# Patient Record
Sex: Female | Born: 1960 | Race: White | Hispanic: No | Marital: Married | Smoking: Never smoker
Health system: Southern US, Community
[De-identification: ages and names within clinical notes are randomized; demographics above are authoritative.]

## PROBLEM LIST (undated history)

## (undated) DIAGNOSIS — T7840XA Allergy, unspecified, initial encounter: Secondary | ICD-10-CM

## (undated) DIAGNOSIS — L719 Rosacea, unspecified: Secondary | ICD-10-CM

## (undated) DIAGNOSIS — G43909 Migraine, unspecified, not intractable, without status migrainosus: Secondary | ICD-10-CM

## (undated) DIAGNOSIS — I10 Essential (primary) hypertension: Secondary | ICD-10-CM

## (undated) HISTORY — DX: Migraine, unspecified, not intractable, without status migrainosus: G43.909

## (undated) HISTORY — DX: Rosacea, unspecified: L71.9

## (undated) HISTORY — DX: Essential (primary) hypertension: I10

## (undated) HISTORY — DX: Allergy, unspecified, initial encounter: T78.40XA

---

## 1999-08-06 ENCOUNTER — Other Ambulatory Visit: Admission: RE | Admit: 1999-08-06 | Discharge: 1999-08-06 | Payer: Self-pay | Admitting: Obstetrics and Gynecology

## 2000-09-06 ENCOUNTER — Encounter: Payer: Self-pay | Admitting: Obstetrics and Gynecology

## 2000-09-06 ENCOUNTER — Encounter: Admission: RE | Admit: 2000-09-06 | Discharge: 2000-09-06 | Payer: Self-pay | Admitting: Obstetrics and Gynecology

## 2000-09-10 ENCOUNTER — Encounter: Payer: Self-pay | Admitting: Obstetrics and Gynecology

## 2000-09-10 ENCOUNTER — Encounter: Admission: RE | Admit: 2000-09-10 | Discharge: 2000-09-10 | Payer: Self-pay | Admitting: Obstetrics and Gynecology

## 2001-03-21 ENCOUNTER — Encounter: Payer: Self-pay | Admitting: Obstetrics and Gynecology

## 2001-03-21 ENCOUNTER — Encounter: Admission: RE | Admit: 2001-03-21 | Discharge: 2001-03-21 | Payer: Self-pay | Admitting: Obstetrics and Gynecology

## 2001-09-16 ENCOUNTER — Encounter: Payer: Self-pay | Admitting: Obstetrics and Gynecology

## 2001-09-16 ENCOUNTER — Encounter: Admission: RE | Admit: 2001-09-16 | Discharge: 2001-09-16 | Payer: Self-pay | Admitting: Obstetrics and Gynecology

## 2001-10-17 ENCOUNTER — Encounter: Payer: Self-pay | Admitting: *Deleted

## 2001-10-17 ENCOUNTER — Encounter: Admission: RE | Admit: 2001-10-17 | Discharge: 2001-10-17 | Payer: Self-pay | Admitting: *Deleted

## 2001-10-31 ENCOUNTER — Encounter: Admission: RE | Admit: 2001-10-31 | Discharge: 2002-01-29 | Payer: Self-pay | Admitting: *Deleted

## 2001-11-24 ENCOUNTER — Other Ambulatory Visit: Admission: RE | Admit: 2001-11-24 | Discharge: 2001-11-24 | Payer: Self-pay | Admitting: Obstetrics & Gynecology

## 2002-05-08 ENCOUNTER — Encounter: Admission: RE | Admit: 2002-05-08 | Discharge: 2002-08-06 | Payer: Self-pay | Admitting: *Deleted

## 2003-07-13 ENCOUNTER — Other Ambulatory Visit: Admission: RE | Admit: 2003-07-13 | Discharge: 2003-07-13 | Payer: Self-pay

## 2004-03-03 ENCOUNTER — Encounter: Admission: RE | Admit: 2004-03-03 | Discharge: 2004-03-03 | Payer: Self-pay | Admitting: Family Medicine

## 2005-11-13 ENCOUNTER — Encounter: Admission: RE | Admit: 2005-11-13 | Discharge: 2005-11-13 | Payer: Self-pay | Admitting: Family Medicine

## 2006-11-17 ENCOUNTER — Encounter: Admission: RE | Admit: 2006-11-17 | Discharge: 2006-11-17 | Payer: Self-pay | Admitting: Family Medicine

## 2008-04-06 ENCOUNTER — Encounter: Admission: RE | Admit: 2008-04-06 | Discharge: 2008-04-06 | Payer: Self-pay | Admitting: Family Medicine

## 2009-07-04 ENCOUNTER — Encounter: Admission: RE | Admit: 2009-07-04 | Discharge: 2009-07-04 | Payer: Self-pay | Admitting: Family Medicine

## 2010-12-07 ENCOUNTER — Encounter: Payer: Self-pay | Admitting: Family Medicine

## 2011-03-23 ENCOUNTER — Other Ambulatory Visit: Payer: Self-pay | Admitting: Family Medicine

## 2011-03-23 DIAGNOSIS — Z1231 Encounter for screening mammogram for malignant neoplasm of breast: Secondary | ICD-10-CM

## 2011-05-01 ENCOUNTER — Ambulatory Visit
Admission: RE | Admit: 2011-05-01 | Discharge: 2011-05-01 | Disposition: A | Discharge status: Home / Self Care | Payer: 59 | Source: Ambulatory Visit | Attending: Family Medicine | Admitting: Family Medicine

## 2011-05-01 DIAGNOSIS — Z1231 Encounter for screening mammogram for malignant neoplasm of breast: Secondary | ICD-10-CM

## 2011-05-04 ENCOUNTER — Other Ambulatory Visit: Payer: Self-pay | Admitting: Family Medicine

## 2011-05-04 DIAGNOSIS — R928 Other abnormal and inconclusive findings on diagnostic imaging of breast: Secondary | ICD-10-CM

## 2011-05-07 LAB — HM PAP SMEAR

## 2011-05-11 ENCOUNTER — Ambulatory Visit
Admission: RE | Admit: 2011-05-11 | Discharge: 2011-05-11 | Disposition: A | Discharge status: Home / Self Care | Payer: 59 | Source: Ambulatory Visit | Attending: Family Medicine | Admitting: Family Medicine

## 2011-05-11 DIAGNOSIS — R928 Other abnormal and inconclusive findings on diagnostic imaging of breast: Secondary | ICD-10-CM

## 2012-05-25 ENCOUNTER — Other Ambulatory Visit: Payer: Self-pay | Admitting: Family Medicine

## 2012-05-25 DIAGNOSIS — Z1231 Encounter for screening mammogram for malignant neoplasm of breast: Secondary | ICD-10-CM

## 2012-06-20 ENCOUNTER — Ambulatory Visit: Payer: 59

## 2012-07-11 ENCOUNTER — Ambulatory Visit
Admission: RE | Admit: 2012-07-11 | Discharge: 2012-07-11 | Disposition: A | Discharge status: Home / Self Care | Payer: 59 | Source: Ambulatory Visit | Attending: Family Medicine | Admitting: Family Medicine

## 2012-07-11 DIAGNOSIS — Z1231 Encounter for screening mammogram for malignant neoplasm of breast: Secondary | ICD-10-CM

## 2013-06-22 ENCOUNTER — Other Ambulatory Visit: Payer: Self-pay | Admitting: Family Medicine

## 2013-07-10 ENCOUNTER — Encounter: Payer: Self-pay | Admitting: Family Medicine

## 2013-07-10 ENCOUNTER — Ambulatory Visit (INDEPENDENT_AMBULATORY_CARE_PROVIDER_SITE_OTHER): Payer: 59 | Admitting: Family Medicine

## 2013-07-10 VITALS — BP 114/78 | HR 85 | Ht 62.5 in | Wt 168.0 lb

## 2013-07-10 DIAGNOSIS — Z309 Encounter for contraceptive management, unspecified: Secondary | ICD-10-CM

## 2013-07-10 DIAGNOSIS — F411 Generalized anxiety disorder: Secondary | ICD-10-CM

## 2013-07-10 DIAGNOSIS — M7989 Other specified soft tissue disorders: Secondary | ICD-10-CM

## 2013-07-10 DIAGNOSIS — G43909 Migraine, unspecified, not intractable, without status migrainosus: Secondary | ICD-10-CM

## 2013-07-10 DIAGNOSIS — I10 Essential (primary) hypertension: Secondary | ICD-10-CM

## 2013-07-10 DIAGNOSIS — Z23 Encounter for immunization: Secondary | ICD-10-CM

## 2013-07-10 MED ORDER — ELETRIPTAN HYDROBROMIDE 40 MG PO TABS: 40.0000 mg | ORAL_TABLET | ORAL | Status: AC | PRN

## 2013-07-10 MED ORDER — FLUOXETINE HCL 40 MG PO CAPS: 40.0000 mg | ORAL_CAPSULE | Freq: Every day | ORAL | Status: AC

## 2013-07-10 MED ORDER — AMLODIPINE BESY-BENAZEPRIL HCL 5-10 MG PO CAPS: 1.0000 | ORAL_CAPSULE | Freq: Every day | ORAL | Status: AC

## 2013-07-10 MED ORDER — HYDROCHLOROTHIAZIDE 25 MG PO TABS: 25.0000 mg | ORAL_TABLET | Freq: Every day | ORAL | Status: AC

## 2013-07-10 MED ORDER — DROSPIRENONE-ETHINYL ESTRADIOL 3-0.02 MG PO TABS: 1.0000 | ORAL_TABLET | Freq: Every day | ORAL | Status: DC

## 2013-07-10 NOTE — Progress Notes (Signed)
  Subjective:    Patient ID: Valerie Glass, female    DOB: 10/25/1961, 52 y.o.   MRN: [id number]  HPI  [name] is here today to discuss the conditions listed below:  1)  Mood:  Her mood is stable on fluoxetine.  She needs a refill on it.   2)  Hypertension:  She has done well on her Lotrel.  Occasionally she takes HCTZ when she feels "puffy."  She needs refills on both medications.   3)  Oral Contraceptives:  She needs a refill on her Yaz.     Review of Systems  Constitutional: Negative.   HENT: Negative.   Eyes: Negative.   Respiratory: Negative.   Cardiovascular: Negative.   Gastrointestinal: Negative.   Endocrine: Negative.   Genitourinary: Negative.   Musculoskeletal: Negative.   Skin: Negative.   Allergic/Immunologic: Negative.   Neurological: Negative.   Hematological: Negative.   Psychiatric/Behavioral: Negative.     Past Medical History  Diagnosis Date  . Hypertension   . Allergy   . Rosacea   . Migraine     Family History  Problem Relation Age of Onset  . Cancer Mother     Breast Cancer  . Hypertension Father   . Cancer Father     Colon Cancer  . Diabetes Maternal Uncle   . Heart disease Maternal Grandmother   . Diabetes Maternal Grandfather     History   Social History Narrative   Marital Status:  Married Therapist, art)   Children:  G1 P1001 Burnett Harry)   Pets:  Dogs (2)   Living Situation: Lives with spouse and daughter   Occupation: Chief of Staff   Education: Master's Degree Licensed conveyancer)    Tobacco Use/Exposure:  None    Alcohol Use:  None   Drug Use:  None   Diet:  [employer] Watchers   Exercise:  3 x week   Hobbies: Crafts, Reading      Objective:   Physical Exam  Vitals reviewed. Constitutional: She is oriented to person, place, and time. She appears well-developed and well-nourished. No distress.  Cardiovascular: Normal rate, regular rhythm and normal heart sounds.   Pulmonary/Chest: Breath sounds normal.  Neurological: She is  alert and oriented to person, place, and time.  Psychiatric: She has a normal mood and affect.          Assessment & Plan:

## 2013-08-20 ENCOUNTER — Encounter: Payer: Self-pay | Admitting: Family Medicine

## 2013-09-05 DIAGNOSIS — Z309 Encounter for contraceptive management, unspecified: Secondary | ICD-10-CM | POA: Insufficient documentation

## 2013-09-05 DIAGNOSIS — I10 Essential (primary) hypertension: Secondary | ICD-10-CM | POA: Insufficient documentation

## 2013-09-05 DIAGNOSIS — F411 Generalized anxiety disorder: Secondary | ICD-10-CM | POA: Insufficient documentation

## 2013-09-05 DIAGNOSIS — G43909 Migraine, unspecified, not intractable, without status migrainosus: Secondary | ICD-10-CM | POA: Insufficient documentation

## 2013-09-05 DIAGNOSIS — M7989 Other specified soft tissue disorders: Secondary | ICD-10-CM | POA: Insufficient documentation

## 2013-09-05 DIAGNOSIS — Z23 Encounter for immunization: Secondary | ICD-10-CM | POA: Insufficient documentation

## 2013-09-05 NOTE — Assessment & Plan Note (Signed)
Refilled her YAZ.

## 2013-09-05 NOTE — Assessment & Plan Note (Signed)
Refilled her Lotrel.

## 2013-09-05 NOTE — Assessment & Plan Note (Signed)
Refilled her Relpax.

## 2013-09-05 NOTE — Assessment & Plan Note (Signed)
Refilled her HCTZ 

## 2013-09-05 NOTE — Assessment & Plan Note (Addendum)
Refilled her Prozac.

## 2013-09-05 NOTE — Assessment & Plan Note (Signed)
The patient confirmed that they are not allergic to eggs and have never had a bad reaction with the flu shot in the past.  The vaccination was given without difficulty.   

## 2013-12-01 ENCOUNTER — Ambulatory Visit (INDEPENDENT_AMBULATORY_CARE_PROVIDER_SITE_OTHER): Payer: 59 | Admitting: Family Medicine

## 2013-12-01 ENCOUNTER — Encounter (INDEPENDENT_AMBULATORY_CARE_PROVIDER_SITE_OTHER): Payer: Self-pay

## 2013-12-01 ENCOUNTER — Encounter: Payer: Self-pay | Admitting: Family Medicine

## 2013-12-01 VITALS — BP 122/84 | HR 86 | Resp 16 | Ht 62.25 in | Wt 176.0 lb

## 2013-12-01 DIAGNOSIS — B3731 Acute candidiasis of vulva and vagina: Secondary | ICD-10-CM

## 2013-12-01 DIAGNOSIS — H0019 Chalazion unspecified eye, unspecified eyelid: Secondary | ICD-10-CM

## 2013-12-01 DIAGNOSIS — H0011 Chalazion right upper eyelid: Secondary | ICD-10-CM

## 2013-12-01 DIAGNOSIS — B373 Candidiasis of vulva and vagina: Secondary | ICD-10-CM

## 2013-12-01 MED ORDER — FLUCONAZOLE 150 MG PO TABS: 150.0000 mg | ORAL_TABLET | Freq: Once | ORAL | Status: AC

## 2013-12-01 MED ORDER — CEPHALEXIN 500 MG PO CAPS: 500.0000 mg | ORAL_CAPSULE | Freq: Four times a day (QID) | ORAL | Status: AC

## 2013-12-01 MED ORDER — TERCONAZOLE 0.8 % VA CREA: 1.0000 | TOPICAL_CREAM | Freq: Every day | VAGINAL | Status: AC

## 2013-12-01 MED ORDER — ERYTHROMYCIN 5 MG/GM OP OINT: TOPICAL_OINTMENT | OPHTHALMIC | Status: AC

## 2013-12-01 NOTE — Progress Notes (Signed)
Subjective:    Patient ID: Valerie Glass, female    DOB: 07-Jul-1961, 53 y.o.   MRN: [id number]  HPI  Valerie Glass is here today complaining of a swelling in her right eyelid which she has had for about a month.  Her problem began as a small stye which has gotten larger and is painful.  She has not noticed any discharge.  She has used [employer] Eye Wipes and has applied warm compresses which have not helped very much.      Review of Systems  Eyes: Negative for visual disturbance.       Swelling and redness in her right eyelid.       Past Medical History  Diagnosis Date  . Hypertension   . Allergy   . Rosacea   . Migraine      History   Social History Narrative   Marital Status:  Married Therapist, art(Jeff)   Children:  G1 P1001 Burnett Harry(Shelly)   Pets:  Dogs (2)   Living Situation: Lives with spouse and daughter   Occupation: Environmental education officerChildren Librarian - Chief of [employer]   Education: Psychiatric nurseMaster's Degree (Library Science)    Tobacco Use/Exposure:  None    Alcohol Use:  None   Drug Use:  None   Diet:  [employer] Watchers   Exercise:  3 x week   Hobbies: Crafts, Reading     Family History  Problem Relation Age of Onset  . Cancer Mother     Breast Cancer  . Hypertension Father   . Cancer Father     Colon Cancer  . Diabetes Maternal Uncle   . Heart disease Maternal Grandmother   . Diabetes Maternal Grandfather      Current Outpatient Prescriptions on File Prior to Visit  Medication Sig Dispense Refill  . amLODipine-benazepril (LOTREL) 5-10 MG per capsule Take 1 capsule by mouth daily.  90 capsule  3  . eletriptan (RELPAX) 40 MG tablet Take 1 tablet (40 mg total) by mouth as needed for migraine. One tablet by mouth at onset of headache. May repeat in 2 hours if headache persists or recurs.  12 tablet  11  . FLUoxetine (PROZAC) 40 MG capsule Take 1 capsule (40 mg total) by mouth daily.  90 capsule  1  . hydrochlorothiazide (HYDRODIURIL) 25 MG tablet Take 1 tablet (25 mg total) by mouth daily.  90  tablet  1   No current facility-administered medications on file prior to visit.     No Known Allergies   Immunization History  Administered Date(s) Administered  . Influenza, Seasonal, Injecte, Preservative Fre 07/10/2013  . Tdap 04/19/2008      Objective:   Physical Exam  Constitutional: She appears well-nourished. No distress.  HENT:  Head: Normocephalic.  Mouth/Throat: No oropharyngeal exudate.  Eyes: Conjunctivae are normal. Right eye exhibits no discharge. Left eye exhibits no discharge.    Neck: Neck supple.  Cardiovascular: Normal rate, regular rhythm and normal heart sounds.  Exam reveals no gallop and no friction rub.   No murmur heard. Pulmonary/Chest: Effort normal and breath sounds normal. She has no wheezes. She exhibits no tenderness.  Lymphadenopathy:    She has no cervical adenopathy.  Neurological: She is alert.  Skin: Skin is warm and dry. No rash noted.  Psychiatric: She has a normal mood and affect.      Assessment & Plan:    Valerie Glass was seen today for stye.  Diagnoses and associated orders for this visit:  Chalazion of right upper eyelid -  erythromycin ophthalmic ointment; Apply a 1/2 in strip of ointment QID to right upper eyelid for 7 days - cephALEXin (KEFLEX) 500 MG capsule; Take 1 capsule (500 mg total) by mouth 4 (four) times daily.  Candidiasis of genitalia in female - fluconazole (DIFLUCAN) 150 MG tablet; Take 1 tablet (150 mg total) by mouth once. - terconazole (TERAZOL 3) 0.8 % vaginal cream; Place 1 applicator vaginally at bedtime. X 3 Days

## 2013-12-01 NOTE — Patient Instructions (Addendum)
1)  Chalazion/Sty - Apply the antibiotic ointment 4 x per day for 7 days  If you are not seeing much improvement after 4 days then add the Keflex.  If you do not improve, F/U with your eye doctor.    Sty A sty (hordeolum) is an infection of a gland in the eyelid located at the base of the eyelash. A sty may develop a white or yellow head of pus. It can be puffy (swollen). Usually, the sty will burst and pus will come out on its own. They do not leave lumps in the eyelid once they drain. A sty is often confused with another form of cyst of the eyelid called a chalazion. Chalazions occur within the eyelid and not on the edge where the bases of the eyelashes are. They often are red, sore and then form firm lumps in the eyelid. CAUSES   Germs (bacteria).  Lasting (chronic) eyelid inflammation. SYMPTOMS   Tenderness, redness and swelling along the edge of the eyelid at the base of the eyelashes.  Sometimes, there is a white or yellow head of pus. It may or may not drain. DIAGNOSIS  An ophthalmologist will be able to distinguish between a sty and a chalazion and treat the condition appropriately.  TREATMENT   Styes are typically treated with warm packs (compresses) until drainage occurs.  In rare cases, medicines that kill germs (antibiotics) may be prescribed. These antibiotics may be in the form of drops, cream or pills.  If a hard lump has formed, it is generally necessary to do a small incision and remove the hardened contents of the cyst in a minor surgical procedure done in the office.  In suspicious cases, your caregiver may send the contents of the cyst to the lab to be certain that it is not a rare, but dangerous form of cancer of the glands of the eyelid. HOME CARE INSTRUCTIONS   Wash your hands often and dry them with a clean towel. Avoid touching your eyelid. This may spread the infection to other parts of the eye.  Apply heat to your eyelid for 10 to 20 minutes, several times a  day, to ease pain and help to heal it faster.  Do not squeeze the sty. Allow it to drain on its own. Wash your eyelid carefully 3 to 4 times per day to remove any pus. SEEK IMMEDIATE MEDICAL CARE IF:   Your eye becomes painful or puffy (swollen).  Your vision changes.  Your sty does not drain by itself within 3 days.  Your sty comes back within a short period of time, even with treatment.  You have redness (inflammation) around the eye.  You have a fever. Document Released: 08/12/2005 Document Revised: 01/25/2012 Document Reviewed: 04/16/2009 [facility] Patient Information 2014 [employer], [place].  [id number]

## 2013-12-26 ENCOUNTER — Other Ambulatory Visit: Payer: Self-pay

## 2013-12-26 DIAGNOSIS — Z1231 Encounter for screening mammogram for malignant neoplasm of breast: Secondary | ICD-10-CM

## 2013-12-26 DIAGNOSIS — Z803 Family history of malignant neoplasm of breast: Secondary | ICD-10-CM

## 2014-01-11 ENCOUNTER — Ambulatory Visit: Payer: 59

## 2014-03-06 ENCOUNTER — Other Ambulatory Visit: Payer: Self-pay | Admitting: Family Medicine

## 2014-05-11 ENCOUNTER — Other Ambulatory Visit: Payer: Self-pay | Admitting: Family Medicine

## 2014-05-11 DIAGNOSIS — E781 Pure hyperglyceridemia: Secondary | ICD-10-CM

## 2014-05-11 MED ORDER — OMEGA-3-ACID ETHYL ESTERS 1 G PO CAPS: 2.0000 g | ORAL_CAPSULE | Freq: Two times a day (BID) | ORAL | Status: AC

## 2015-02-27 ENCOUNTER — Other Ambulatory Visit: Payer: Self-pay

## 2015-02-27 DIAGNOSIS — Z1231 Encounter for screening mammogram for malignant neoplasm of breast: Secondary | ICD-10-CM

## 2015-03-15 ENCOUNTER — Ambulatory Visit: Payer: Self-pay

## 2015-03-27 ENCOUNTER — Ambulatory Visit
Admission: RE | Admit: 2015-03-27 | Discharge: 2015-03-27 | Disposition: A | Discharge status: Home / Self Care | Payer: 59 | Source: Ambulatory Visit

## 2015-03-27 DIAGNOSIS — Z1231 Encounter for screening mammogram for malignant neoplasm of breast: Secondary | ICD-10-CM

## 2015-03-29 ENCOUNTER — Other Ambulatory Visit: Payer: Self-pay | Admitting: Family Medicine

## 2015-03-29 DIAGNOSIS — R928 Other abnormal and inconclusive findings on diagnostic imaging of breast: Secondary | ICD-10-CM

## 2015-04-04 ENCOUNTER — Ambulatory Visit
Admission: RE | Admit: 2015-04-04 | Discharge: 2015-04-04 | Disposition: A | Discharge status: Home / Self Care | Payer: 59 | Source: Ambulatory Visit | Attending: Family Medicine | Admitting: Family Medicine

## 2015-04-04 DIAGNOSIS — R928 Other abnormal and inconclusive findings on diagnostic imaging of breast: Secondary | ICD-10-CM

## 2016-08-16 IMAGING — MG MM SCREENING BREAST TOMO BILATERAL
8 series · 8 of 24 positions shown · non-contrast
Comparison: Previous exam(s).

CLINICAL DATA: Screening.

EXAM:
DIGITAL SCREENING BILATERAL MAMMOGRAM WITH 3D TOMO WITH CAD

[L MLO]
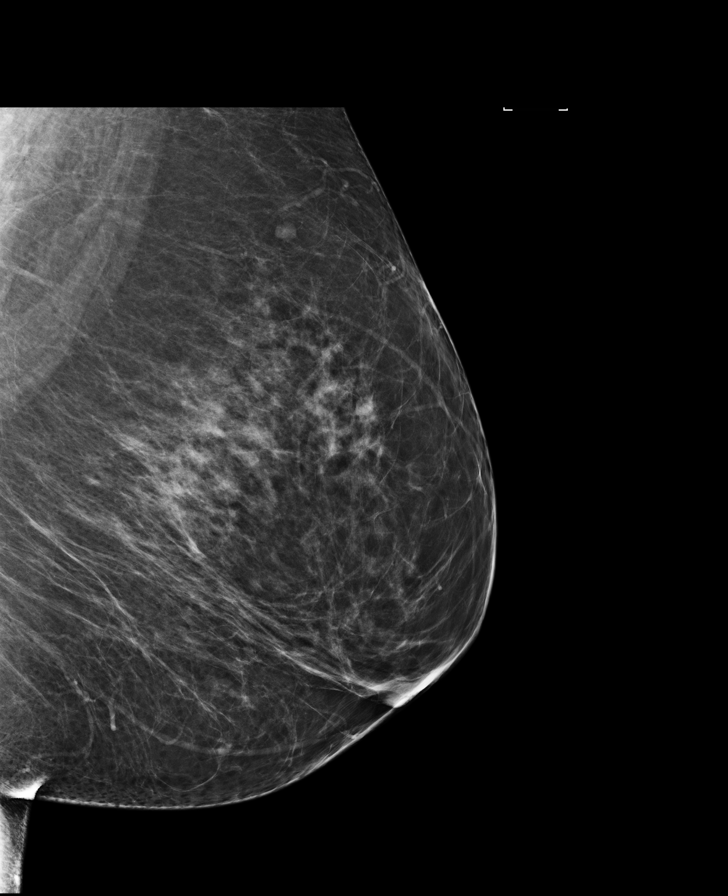

[R MLO]
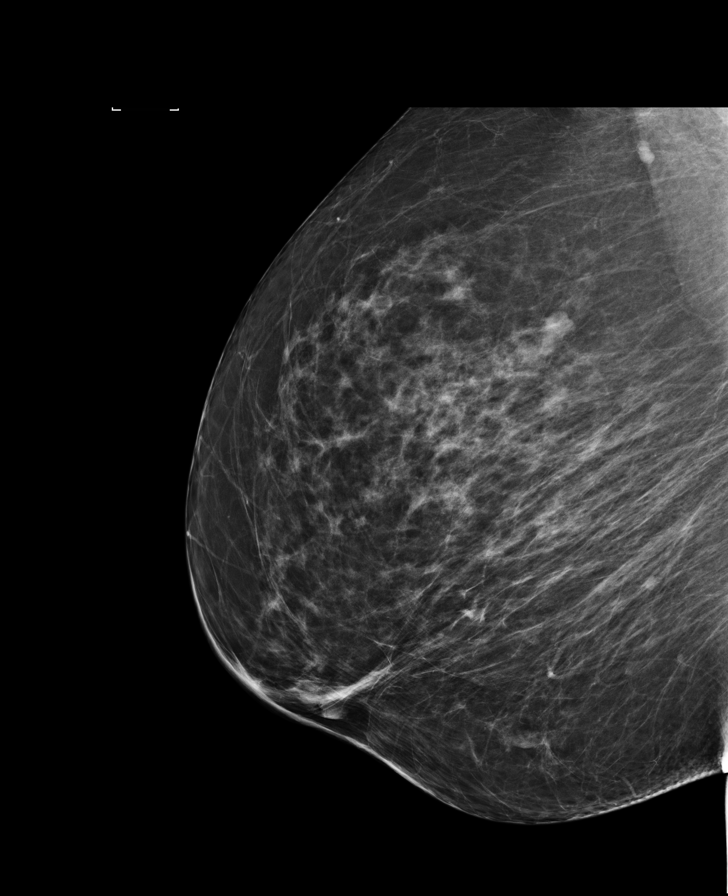

[L CC]
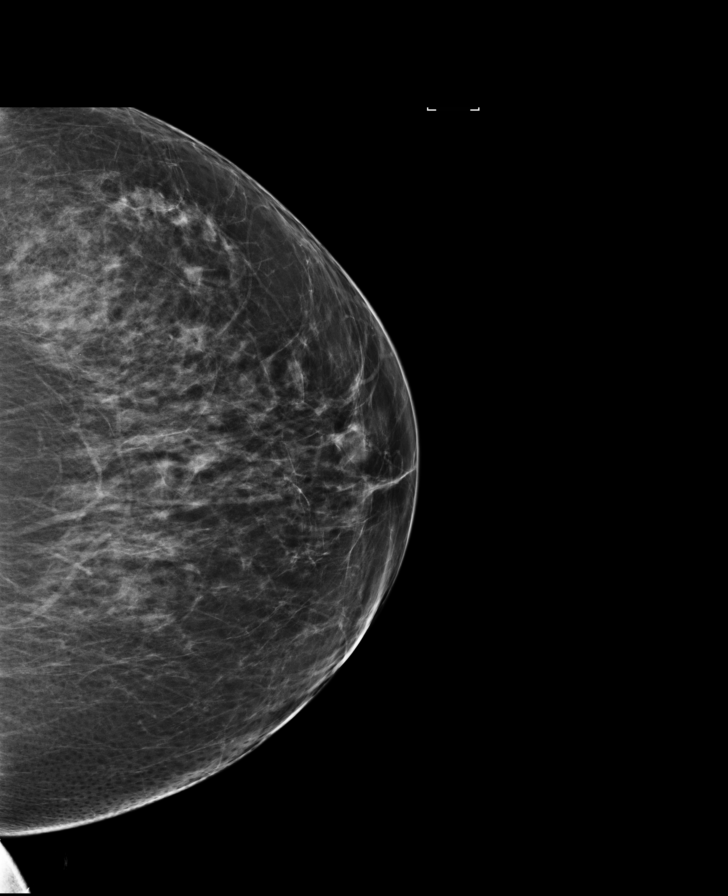

[R CC]
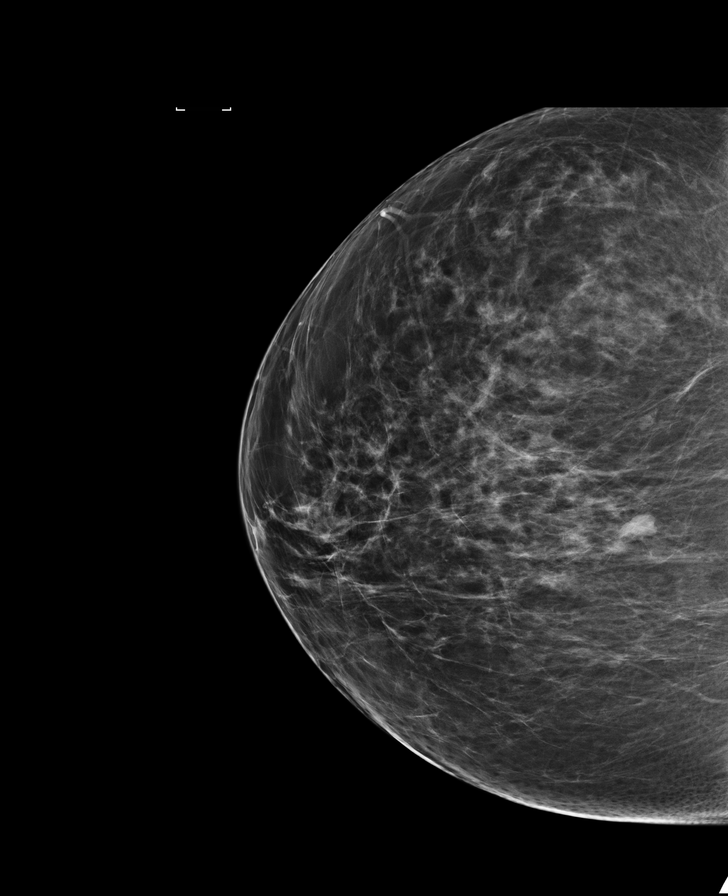

[R MLO tomo · tomo slice 45/89.0]
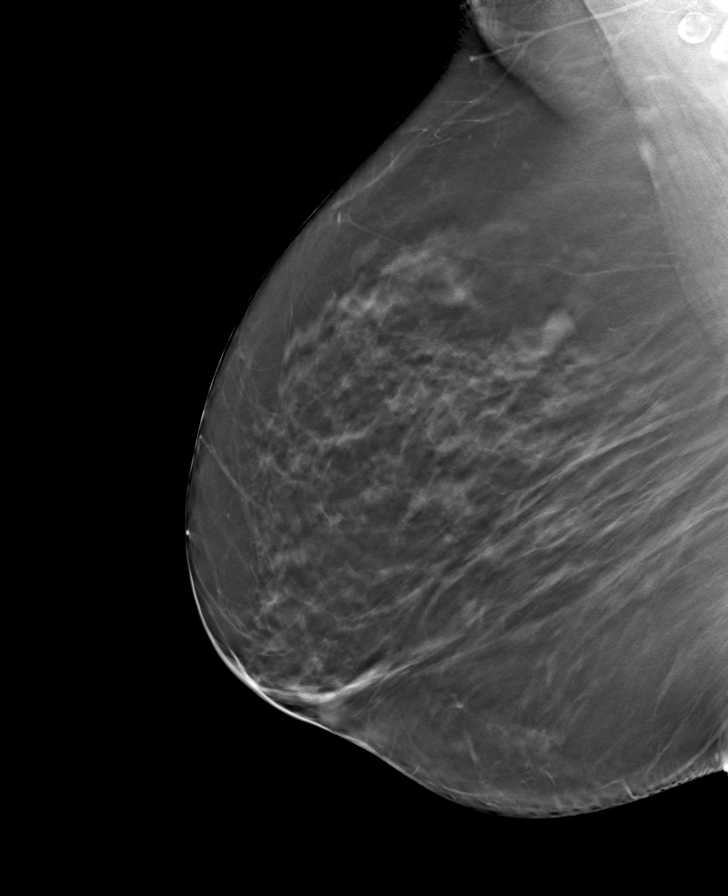

[R CC tomo · tomo slice 43/86.0]
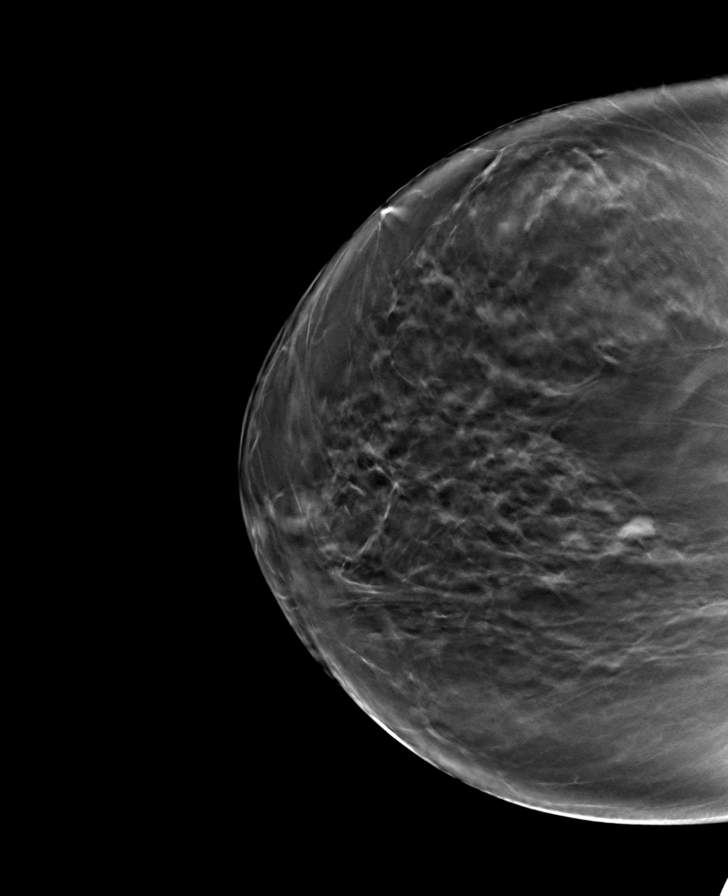

[L CC tomo · tomo slice 41/80.0]
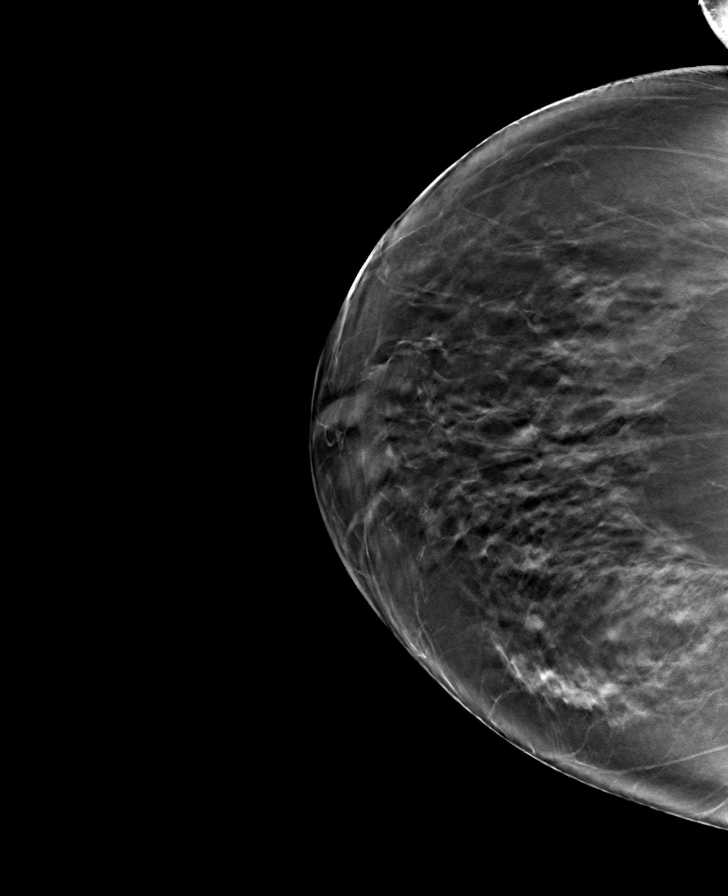

[L MLO tomo · tomo slice 47/92.0]
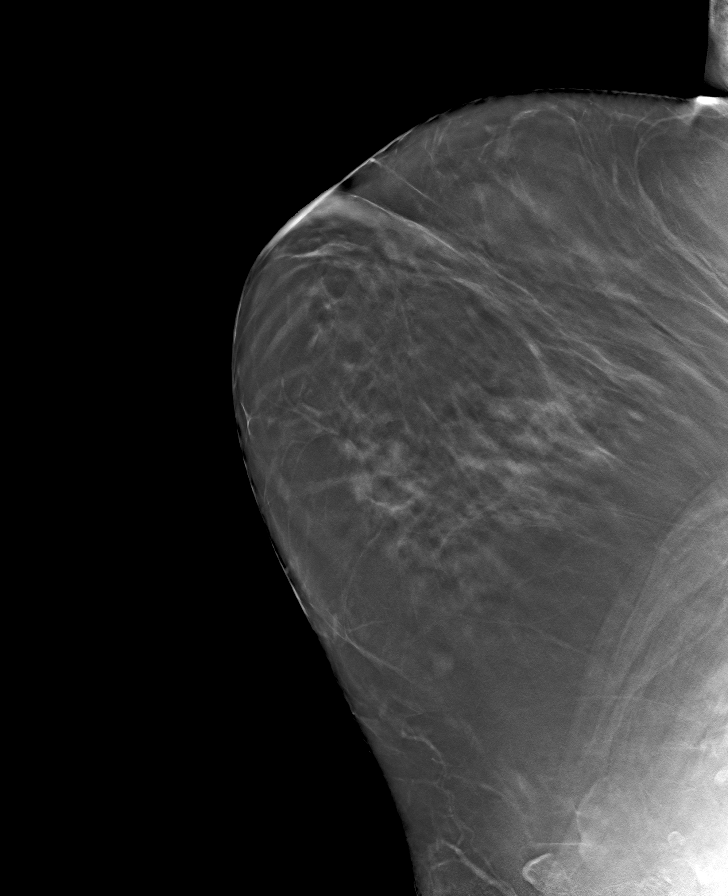

[8 of 24 positions shown; findings below may reference images not displayed]

ACR Breast Density Category b: There are scattered areas of
fibroglandular density.
FINDINGS: In the left breast, a possible mass warrants further evaluation. In
the right breast, no findings suspicious for malignancy.

Images were processed with CAD.
IMPRESSION: Further evaluation is suggested for possible mass in the left
breast.

RECOMMENDATION:
Diagnostic mammogram and possibly ultrasound of the left breast.
(Code:[id number])

The patient will be contacted regarding the findings, and additional
imaging will be scheduled.

BI-RADS CATEGORY  0: Incomplete. Need additional imaging evaluation
and/or prior mammograms for comparison.

## 2017-01-07 ENCOUNTER — Other Ambulatory Visit: Payer: Self-pay | Admitting: Family Medicine

## 2017-01-07 DIAGNOSIS — Z1231 Encounter for screening mammogram for malignant neoplasm of breast: Secondary | ICD-10-CM

## 2017-01-26 ENCOUNTER — Ambulatory Visit
Admission: RE | Admit: 2017-01-26 | Discharge: 2017-01-26 | Disposition: A | Discharge status: Home / Self Care | Payer: 59 | Source: Ambulatory Visit | Attending: Family Medicine | Admitting: Family Medicine

## 2017-01-26 DIAGNOSIS — Z1231 Encounter for screening mammogram for malignant neoplasm of breast: Secondary | ICD-10-CM

## 2019-10-17 ENCOUNTER — Other Ambulatory Visit: Payer: Self-pay | Admitting: Family Medicine

## 2019-10-17 DIAGNOSIS — Z1231 Encounter for screening mammogram for malignant neoplasm of breast: Secondary | ICD-10-CM

## 2019-12-08 ENCOUNTER — Ambulatory Visit: Payer: 59

## 2020-01-19 ENCOUNTER — Other Ambulatory Visit: Payer: Self-pay

## 2020-01-19 ENCOUNTER — Ambulatory Visit
Admission: RE | Admit: 2020-01-19 | Discharge: 2020-01-19 | Disposition: A | Discharge status: Home / Self Care | Payer: 59 | Source: Ambulatory Visit | Attending: Family Medicine | Admitting: Family Medicine

## 2020-01-19 DIAGNOSIS — Z1231 Encounter for screening mammogram for malignant neoplasm of breast: Secondary | ICD-10-CM

## 2020-08-05 ENCOUNTER — Other Ambulatory Visit: Payer: Self-pay

## 2020-08-05 ENCOUNTER — Encounter (INDEPENDENT_AMBULATORY_CARE_PROVIDER_SITE_OTHER): Payer: Self-pay | Admitting: Ophthalmology

## 2020-08-05 ENCOUNTER — Ambulatory Visit (INDEPENDENT_AMBULATORY_CARE_PROVIDER_SITE_OTHER): Payer: 59 | Admitting: Ophthalmology

## 2020-08-05 DIAGNOSIS — H35372 Puckering of macula, left eye: Secondary | ICD-10-CM | POA: Diagnosis not present

## 2020-08-05 DIAGNOSIS — H33102 Unspecified retinoschisis, left eye: Secondary | ICD-10-CM

## 2020-08-05 DIAGNOSIS — H43811 Vitreous degeneration, right eye: Secondary | ICD-10-CM

## 2020-08-05 DIAGNOSIS — H43822 Vitreomacular adhesion, left eye: Secondary | ICD-10-CM | POA: Diagnosis not present

## 2020-08-05 DIAGNOSIS — H353131 Nonexudative age-related macular degeneration, bilateral, early dry stage: Secondary | ICD-10-CM

## 2020-08-05 NOTE — Patient Instructions (Signed)
Patient encouraged to notify the office promptly if new onset visual acuity distortion, flashes of light curtain of darkness develops.

## 2020-08-05 NOTE — Assessment & Plan Note (Signed)
None foveal distorting, with good acuity, will observe.

## 2020-08-05 NOTE — Assessment & Plan Note (Signed)
Minor and observe

## 2020-08-05 NOTE — Progress Notes (Signed)
08/05/2020     CHIEF COMPLAINT Patient presents for Retina Follow Up   HISTORY OF PRESENT ILLNESS: Valerie Glass is a 59 y.o. female who presents to the clinic today for:   HPI    Retina Follow Up    Patient presents with  Dry AMD.  In both eyes.  This started 1 year ago.  Severity is mild.  Duration of 1 year.  Since onset it is stable.          Comments    1 Year AMD F/U OU  Pt denies noticeable changes to [place] OU since last visit. Pt denies ocular pain, flashes of light, or floaters OU. Pt reports itching OU due to seasonal allergies.       Last edited by Ileana Roup, COA on 08/05/2020  9:17 AM. (History)      Referring physician: Gillian Scarce, MD [address] [place],  [place] [address]  HISTORICAL INFORMATION:   Selected notes from the MEDICAL RECORD NUMBER       CURRENT MEDICATIONS: No current outpatient medications on file. (Ophthalmic Drugs)   No current facility-administered medications for this visit. (Ophthalmic Drugs)   Current Outpatient Medications (Other)  Medication Sig  . amLODipine-benazepril (LOTREL) 5-10 MG per capsule Take 1 capsule by mouth daily.  Marland Kitchen eletriptan (RELPAX) 40 MG tablet Take 1 tablet (40 mg total) by mouth as needed for migraine. One tablet by mouth at onset of headache. May repeat in 2 hours if headache persists or recurs.  Marland Kitchen FLUoxetine (PROZAC) 40 MG capsule Take 1 capsule (40 mg total) by mouth daily.  . hydrochlorothiazide (HYDRODIURIL) 25 MG tablet Take 1 tablet (25 mg total) by mouth daily.  Marland Kitchen omega-3 acid ethyl esters (LOVAZA) 1 G capsule Take 2 capsules (2 g total) by mouth 2 (two) times daily.  Marland Kitchen terconazole (TERAZOL 3) 0.8 % vaginal cream Place 1 applicator vaginally at bedtime. X 3 Days   No current facility-administered medications for this visit. (Other)      REVIEW OF SYSTEMS:    ALLERGIES No Known Allergies  PAST MEDICAL HISTORY Past Medical History:  Diagnosis Date  . Allergy   .  Hypertension   . Migraine   . Rosacea    History reviewed. No pertinent surgical history.  FAMILY HISTORY Family History  Problem Relation Age of Onset  . Breast cancer Mother 11  . Hypertension Father   . Cancer Father        Colon Cancer  . Diabetes Maternal Uncle   . Heart disease Maternal Grandmother   . Diabetes Maternal Grandfather     SOCIAL HISTORY Social History   Tobacco Use  . Smoking status: Never Smoker  . Smokeless tobacco: Never Used  Substance Use Topics  . Alcohol use: No  . Drug use: No         OPHTHALMIC EXAM:  Base Eye Exam    Visual Acuity (ETDRS)      Right Left   Dist cc 20/20 -1 20/25 -1   Correction: Glasses       Tonometry (Tonopen, 9:18 AM)      Right Left   Pressure 13 10       Pupils      Pupils Dark Light Shape React APD   Right PERRL 5 4 Round Brisk None   Left PERRL 5 4 Round Brisk None       Visual Fields (Counting fingers)      Left  Right    Full Full       Extraocular Movement      Right Left    Full Full       Neuro/Psych    Oriented x3: Yes   Mood/Affect: Normal       Dilation    Both eyes: 1.0% Mydriacyl, 2.5% Phenylephrine @ 9:20 AM        Slit Lamp and Fundus Exam    External Exam      Right Left   External Normal Normal       Slit Lamp Exam      Right Left   Lids/Lashes Normal Normal   Conjunctiva/Sclera White and quiet White and quiet   Cornea Clear Clear   Anterior Chamber Deep and quiet Deep and quiet   Iris Round and reactive Round and reactive   Lens 1+ Nuclear sclerosis 1+ Nuclear sclerosis   Anterior Vitreous Normal Normal       Fundus Exam      Right Left   Posterior Vitreous Posterior vitreous detachment Posterior vitreous detachment   Disc Normal Normal   C/D Ratio 0.3 0.3   Macula Hard drusen, no macular thickening Hard drusen, no macular thickening, Epiretinal membrane minor, not clinically visible   Vessels Normal Normal   Periphery Normal Normal          IMAGING  AND PROCEDURES  Imaging and Procedures for 08/05/20  OCT, Retina - OU - Both Eyes       Right Eye Quality was good. Scan locations included subfoveal. Central Foveal Thickness: 281. Progression has been stable. Findings include normal observations.   Left Eye Quality was good. Scan locations included subfoveal. Central Foveal Thickness: 305. Progression has been stable. Findings include abnormal foveal contour, vitreous traction, vitreomacular adhesion .   Notes OD with clean posterior vitreous detachment, normal foveal contour  OS vitreal macular adhesion and traction, none foveal distorting thus observe, with no high risk features evident.                ASSESSMENT/PLAN:  Left epiretinal membrane None foveal distorting, with good acuity, will observe.  Vitreomacular adhesion of left eye Minor and observe  Posterior vitreous detachment of right eye   The nature of posterior vitreous detachment was discussed with the patient as well as its physiology, its age prevalence, and its possible implication regarding retinal breaks and detachment.  An informational brochure was given to the patient.  All the patient's questions were answered.  The patient was asked to return if new or different flashes or floaters develops.   Patient was instructed to contact office immediately if any changes were noticed. I explained to the patient that vitreous inside the eye is similar to jello inside a bowl. As the jello melts it can start to pull away from the bowl, similarly the vitreous throughout our lives can begin to pull away from the retina. That process is called a posterior vitreous detachment. In some cases, the vitreous can tug hard enough on the retina to form a retinal tear. I discussed with the patient the signs and symptoms of a retinal detachment.  Do not rub the eye.      ICD-10-CM   1. 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 Left epiretinal membrane  H35.372 OCT, Retina - OU - Both Eyes  4. Vitreomacular adhesion of left eye  H43.822   5. Posterior vitreous detachment of right eye  H43.811     1.  We will continue to observe OU.  2.  3.  Ophthalmic Meds Ordered this visit:  No orders of the defined types were placed in this encounter.      Return in about 1 year (around 08/05/2021) for DILATE OU, OCT.  Patient Instructions  Patient encouraged to notify the office promptly if new onset visual acuity distortion, flashes of light curtain of darkness develops.    Explained the diagnoses, plan, and follow up with the patient and they expressed understanding.  Patient expressed understanding of the importance of proper follow up care.   Alford Highland Kc Summerson M.D. Diseases & Surgery of the Retina and Vitreous [facility] 08/05/20     Abbreviations: M myopia (nearsighted); A astigmatism; H hyperopia (farsighted); P presbyopia; Mrx spectacle prescription;  CTL contact lenses; OD right eye; OS left eye; OU both eyes  XT exotropia; ET esotropia; PEK punctate epithelial keratitis; PEE punctate epithelial erosions; DES dry eye syndrome; MGD meibomian gland dysfunction; ATs artificial tears; PFAT's preservative free artificial tears; NSC nuclear sclerotic cataract; PSC posterior subcapsular cataract; ERM epi-retinal membrane; PVD posterior vitreous detachment; RD retinal detachment; DM diabetes mellitus; DR diabetic retinopathy; NPDR non-proliferative diabetic retinopathy; PDR proliferative diabetic retinopathy; CSME clinically significant macular edema; DME diabetic macular edema; dbh dot blot hemorrhages; CWS cotton wool spot; POAG primary open angle glaucoma; C/D cup-to-disc ratio; HVF humphrey visual field; GVF goldmann visual field; OCT optical coherence tomography; IOP intraocular pressure; BRVO Branch retinal vein  occlusion; CRVO central retinal vein occlusion; CRAO central retinal artery occlusion; BRAO branch retinal artery occlusion; RT retinal tear; SB scleral buckle; PPV pars plana vitrectomy; VH Vitreous hemorrhage; PRP panretinal laser photocoagulation; IVK intravitreal kenalog; VMT vitreomacular traction; MH Macular hole;  NVD neovascularization of the disc; NVE neovascularization elsewhere; AREDS age related eye disease study; ARMD age related macular degeneration; POAG primary open angle glaucoma; EBMD epithelial/anterior basement membrane dystrophy; ACIOL anterior chamber intraocular lens; IOL intraocular lens; PCIOL posterior chamber intraocular lens; Phaco/IOL phacoemulsification with intraocular lens placement; PRK photorefractive keratectomy; LASIK laser assisted in situ keratomileusis; HTN hypertension; DM diabetes mellitus; COPD chronic obstructive pulmonary disease

## 2020-08-05 NOTE — Assessment & Plan Note (Signed)

## 2020-10-28 ENCOUNTER — Other Ambulatory Visit: Payer: Self-pay

## 2020-10-28 ENCOUNTER — Emergency Department (HOSPITAL_BASED_OUTPATIENT_CLINIC_OR_DEPARTMENT_OTHER)
Admission: EM | Admit: 2020-10-28 | Discharge: 2020-10-28 | Disposition: A | Discharge status: Home / Self Care | Payer: 59 | Attending: Emergency Medicine | Admitting: Emergency Medicine

## 2020-10-28 ENCOUNTER — Encounter (HOSPITAL_BASED_OUTPATIENT_CLINIC_OR_DEPARTMENT_OTHER): Payer: Self-pay | Admitting: Emergency Medicine

## 2020-10-28 DIAGNOSIS — I10 Essential (primary) hypertension: Secondary | ICD-10-CM | POA: Insufficient documentation

## 2020-10-28 DIAGNOSIS — R519 Headache, unspecified: Secondary | ICD-10-CM | POA: Diagnosis present

## 2020-10-28 DIAGNOSIS — J0110 Acute frontal sinusitis, unspecified: Secondary | ICD-10-CM | POA: Insufficient documentation

## 2020-10-28 MED ORDER — AMOXICILLIN-POT CLAVULANATE 875-125 MG PO TABS: 1.0000 | ORAL_TABLET | Freq: Two times a day (BID) | ORAL | 0 refills | Status: AC

## 2020-10-28 MED ORDER — PROCHLORPERAZINE EDISYLATE 10 MG/2ML IJ SOLN
10.0000 mg | Freq: Once | INTRAMUSCULAR | Status: AC
  Administered 2020-10-28: 10 mg via INTRAMUSCULAR
  Filled 2020-10-28: qty 2

## 2020-10-28 MED ORDER — DIPHENHYDRAMINE HCL 50 MG/ML IJ SOLN
25.0000 mg | Freq: Once | INTRAMUSCULAR | Status: AC
  Administered 2020-10-28: 25 mg via INTRAMUSCULAR
  Filled 2020-10-28: qty 1

## 2020-10-28 MED ORDER — AMOXICILLIN-POT CLAVULANATE 875-125 MG PO TABS
1.0000 | ORAL_TABLET | Freq: Once | ORAL | Status: AC
  Administered 2020-10-28: 1 via ORAL
  Filled 2020-10-28: qty 1

## 2020-10-28 NOTE — ED Triage Notes (Signed)
Reports worsening headache since 12/4, seen at urgent care for same. Reports hypertension as well as dizziness. Started atenolol from urgent care for BP last week.

## 2020-10-28 NOTE — Discharge Instructions (Signed)
Take tylenol 2 pills 4 times a day and motrin 4 pills 3 times a day.  Drink plenty of fluids.  Return for worsening shortness of breath, headache, confusion. Follow up with your family doctor.   

## 2020-10-28 NOTE — ED Provider Notes (Signed)
[facility] EMERGENCY DEPARTMENT Provider Note   CSN: [id number] Arrival date & time: 10/28/20  4098     History Chief Complaint  Patient presents with  . Headache    Valerie Glass is a 59 y.o. female.  59 yo F with a chief complaint of a headache.  This is frontal in nature worse when laying back flat.  Better when sitting up.  Worse with closing her eyes.  Going off and on now for about 10 days.  Was seen at urgent care and had an EKG a urine test and was started on with sounds like meclizine.  She was having some dizziness with this but is improved significantly.  Feels a little bit unsteady when she turns her head in a certain direction.  Has been able to ambulate without issue.  No neck rigidity.  No recent travel.  She has having some cough and congestion.  No ear pain.  No head trauma.  No neck pain.  She has a remote history of migraines but thinks this feels different.  The history is provided by the patient and a relative.  Headache Pain location:  Frontal Quality:  Dull Radiates to:  Does not radiate Severity currently:  6/10 Severity at highest:  6/10 Onset quality:  Gradual Duration:  10 days Timing:  Constant Progression:  Worsening Chronicity:  New Similar to prior headaches: no   Relieved by:  Nothing Exacerbated by: lying flat. Ineffective treatments:  None tried Associated symptoms: congestion, cough and dizziness   Associated symptoms: no fever, no myalgias, no nausea and no vomiting        Past Medical History:  Diagnosis Date  . Allergy   . Hypertension   . Migraine   . Rosacea     Patient Active Problem List   Diagnosis Date Noted  . Early stage nonexudative age-related macular degeneration of both eyes 08/05/2020  . 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 Left epiretinal membrane 08/05/2020  . Vitreomacular adhesion of left eye 08/05/2020  . Posterior vitreous detachment of right eye 08/05/2020  . Essential hypertension, benign  09/05/2013  . Unspecified contraceptive management 09/05/2013  . Migraine, unspecified, without mention of intractable migraine without mention of status migrainosus 09/05/2013  . Anxiety state, unspecified 09/05/2013  . Swelling of limb 09/05/2013  . Need for prophylactic vaccination and inoculation against influenza 09/05/2013    History reviewed. No pertinent surgical history.   OB History   No obstetric history on file.     Family History  Problem Relation Age of Onset  . Breast cancer Mother 51  . Hypertension Father   . Cancer Father        Colon Cancer  . Diabetes Maternal Uncle   . Heart disease Maternal Grandmother   . Diabetes Maternal Grandfather     Social History   Tobacco Use  . Smoking status: Never Smoker  . Smokeless tobacco: Never Used  Substance Use Topics  . Alcohol use: No  . Drug use: No    Home Medications Prior to Admission medications   Medication Sig Start Date End Date Taking? Authorizing Provider  amLODipine-benazepril (LOTREL) 5-10 MG per capsule Take 1 capsule by mouth daily. 07/10/13 07/10/14  Gillian Scarce, MD  amoxicillin-clavulanate (AUGMENTIN) 875-125 MG tablet Take 1 tablet by mouth 2 (two) times daily. One po bid x 7 days 10/28/20   Melene Plan, DO  eletriptan (RELPAX) 40 MG tablet Take 1 tablet (40 mg total) by mouth as needed for  migraine. One tablet by mouth at onset of headache. May repeat in 2 hours if headache persists or recurs. 07/10/13 07/10/14  Gillian Scarce, MD  FLUoxetine (PROZAC) 40 MG capsule Take 1 capsule (40 mg total) by mouth daily. 07/10/13 07/10/14  Gillian Scarce, MD  hydrochlorothiazide (HYDRODIURIL) 25 MG tablet Take 1 tablet (25 mg total) by mouth daily. 07/10/13 07/10/14  Gillian Scarce, MD  omega-3 acid ethyl esters (LOVAZA) 1 G capsule Take 2 capsules (2 g total) by mouth 2 (two) times daily. 05/11/14 05/12/15  Gillian Scarce, MD  terconazole (TERAZOL 3) 0.8 % vaginal cream Place 1 applicator vaginally at  bedtime. X 3 Days 12/01/13   Gillian Scarce, MD    Allergies    Patient has no known allergies.  Review of Systems   Review of Systems  Constitutional: Negative for chills and fever.  HENT: Positive for congestion. Negative for rhinorrhea.   Eyes: Negative for redness and visual disturbance.  Respiratory: Positive for cough. Negative for shortness of breath and wheezing.   Cardiovascular: Negative for chest pain and palpitations.  Gastrointestinal: Negative for nausea and vomiting.  Genitourinary: Negative for dysuria and urgency.  Musculoskeletal: Negative for arthralgias and myalgias.  Skin: Negative for pallor and wound.  Neurological: Positive for dizziness and headaches.    Physical Exam Updated Vital Signs BP (!) 156/90   Pulse 87   Temp 98.7 F (37.1 C)   Resp 14   SpO2 99%   Physical Exam Vitals and nursing note reviewed.  Constitutional:      General: She is not in acute distress.    Appearance: She is well-developed and well-nourished. She is not diaphoretic.  HENT:     Head: Normocephalic and atraumatic.     Comments: Swollen turbinates, posterior nasal drip, tender to percussion on the right frontal sinus, tm with serous effusion bilaterally.   Eyes:     Extraocular Movements: EOM normal.     Pupils: Pupils are equal, round, and reactive to light.  Cardiovascular:     Rate and Rhythm: Normal rate and regular rhythm.     Heart sounds: No murmur heard. No friction rub. No gallop.   Pulmonary:     Effort: Pulmonary effort is normal.     Breath sounds: No wheezing or rales.  Abdominal:     General: There is no distension.     Palpations: Abdomen is soft.     Tenderness: There is no abdominal tenderness.  Musculoskeletal:        General: No tenderness or edema.     Cervical back: Normal range of motion and neck supple.  Skin:    General: Skin is warm and dry.  Neurological:     Mental Status: She is alert and oriented to person, place, and time.      GCS: GCS eye subscore is 4. GCS verbal subscore is 5. GCS motor subscore is 6.     Cranial Nerves: Cranial nerves are intact.     Sensory: Sensation is intact.     Motor: Motor function is intact.     Coordination: Coordination is intact.     Gait: Gait is intact.     Comments: Ambulated without difficulty.  Benign neurologic exam.  Psychiatric:        Mood and Affect: Mood and affect normal.        Behavior: Behavior normal.     ED Results / Procedures / Treatments   Labs (all labs ordered  are listed, but only abnormal results are displayed) Labs Reviewed - No data to display  EKG None  Radiology No results found.  Procedures Procedures (including critical care time)  Medications Ordered in ED Medications  prochlorperazine (COMPAZINE) injection 10 mg (has no administration in time range)  diphenhydrAMINE (BENADRYL) injection 25 mg (has no administration in time range)  amoxicillin-clavulanate (AUGMENTIN) 875-125 MG per tablet 1 tablet (1 tablet Oral Given 10/28/20 0735)    ED Course  I have reviewed the triage vital signs and the nursing notes.  Pertinent labs & imaging results that were available during my care of the patient were reviewed by me and considered in my medical decision making (see chart for details).    MDM Rules/Calculators/A&P                          59 yo F with a chief complaint of a headache.  Patient has a history of migraines but thinks this feels different.  She has been having some mild cough and congestion on my exam the patient has signs of an upper respiratory infection.  She does have some tenderness with percussion to the right frontal sinus.  I think most likely this is sinusitis.  No neck rigidity no sudden onset no trauma.  Do not feel that imaging is warranted with a benign neurologic exam.  Will treat with a headache cocktail here.  Started on antibiotics.  PCP follow-up.  7:38 AM:  I have discussed the diagnosis/risks/treatment options  with the patient and family and believe the pt to be eligible for discharge home to follow-up with PCP. We also discussed returning to the ED immediately if new or worsening sx occur. We discussed the sx which are most concerning (e.g., sudden worsening pain, fever, inability to tolerate by mouth) that necessitate immediate return. Medications administered to the patient during their visit and any new prescriptions provided to the patient are listed below.  Medications given during this visit Medications  prochlorperazine (COMPAZINE) injection 10 mg (has no administration in time range)  diphenhydrAMINE (BENADRYL) injection 25 mg (has no administration in time range)  amoxicillin-clavulanate (AUGMENTIN) 875-125 MG per tablet 1 tablet (1 tablet Oral Given 10/28/20 0735)     The patient appears reasonably screen and/or stabilized for discharge and I doubt any other medical condition or other [facility] requiring further screening, evaluation, or treatment in the ED at this time prior to discharge.   Final Clinical Impression(s) / ED Diagnoses Final diagnoses:  Acute frontal sinusitis, recurrence not specified    Rx / DC Orders ED Discharge Orders         Ordered    amoxicillin-clavulanate (AUGMENTIN) 875-125 MG tablet  2 times daily        10/28/20 0724           Melene Plan, DO 10/28/20 [phone]

## 2021-08-07 ENCOUNTER — Encounter (INDEPENDENT_AMBULATORY_CARE_PROVIDER_SITE_OTHER): Payer: 59 | Admitting: Ophthalmology

## 2021-08-11 ENCOUNTER — Other Ambulatory Visit: Payer: Self-pay

## 2021-08-11 ENCOUNTER — Encounter (INDEPENDENT_AMBULATORY_CARE_PROVIDER_SITE_OTHER): Payer: Self-pay | Admitting: Ophthalmology

## 2021-08-11 ENCOUNTER — Ambulatory Visit (INDEPENDENT_AMBULATORY_CARE_PROVIDER_SITE_OTHER): Payer: 59 | Admitting: Ophthalmology

## 2021-08-11 DIAGNOSIS — H353131 Nonexudative age-related macular degeneration, bilateral, early dry stage: Secondary | ICD-10-CM

## 2021-08-11 DIAGNOSIS — H35372 Puckering of macula, left eye: Secondary | ICD-10-CM | POA: Diagnosis not present

## 2021-08-11 DIAGNOSIS — H33102 Unspecified retinoschisis, left eye: Secondary | ICD-10-CM

## 2021-08-11 DIAGNOSIS — H2513 Age-related nuclear cataract, bilateral: Secondary | ICD-10-CM | POA: Insufficient documentation

## 2021-08-11 DIAGNOSIS — H43811 Vitreous degeneration, right eye: Secondary | ICD-10-CM

## 2021-08-11 DIAGNOSIS — H43822 Vitreomacular adhesion, left eye: Secondary | ICD-10-CM

## 2021-08-11 NOTE — Assessment & Plan Note (Signed)
OD physiologic ?

## 2021-08-11 NOTE — Assessment & Plan Note (Signed)
Followed by Dr. Antony Contras.  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

## 2021-08-11 NOTE — Progress Notes (Signed)
08/11/2021     CHIEF COMPLAINT Patient presents for  Chief Complaint  Patient presents with   Retina Follow Up      HISTORY OF PRESENT ILLNESS: Valerie Glass is a 60 y.o. female who presents to the clinic today for:   HPI     Retina Follow Up   Patient presents with  Dry AMD.  In both eyes.  This started 1 year ago.  Severity is mild.  Duration of 1 year.  Since onset it is stable.        Comments   1 yr fu ou oct. Patient states vision is stable and unchanged since last visit. Denies any new floaters or FOL. "Occasionally I still have a floater but that is not new."        Last edited by Nelva Nay on 08/11/2021  9:25 AM.      Referring physician: Antony Contras, MD [address] [place],  [place] [address]  HISTORICAL INFORMATION:   Selected notes from the MEDICAL RECORD NUMBER       CURRENT MEDICATIONS: No current outpatient medications on file. (Ophthalmic Drugs)   No current facility-administered medications for this visit. (Ophthalmic Drugs)   Current Outpatient Medications (Other)  Medication Sig   amLODipine-benazepril (LOTREL) 5-10 MG per capsule Take 1 capsule by mouth daily.   amoxicillin-clavulanate (AUGMENTIN) 875-125 MG tablet Take 1 tablet by mouth 2 (two) times daily. One po bid x 7 days   eletriptan (RELPAX) 40 MG tablet Take 1 tablet (40 mg total) by mouth as needed for migraine. One tablet by mouth at onset of headache. May repeat in 2 hours if headache persists or recurs.   FLUoxetine (PROZAC) 40 MG capsule Take 1 capsule (40 mg total) by mouth daily.   hydrochlorothiazide (HYDRODIURIL) 25 MG tablet Take 1 tablet (25 mg total) by mouth daily.   omega-3 acid ethyl esters (LOVAZA) 1 G capsule Take 2 capsules (2 g total) by mouth 2 (two) times daily.   terconazole (TERAZOL 3) 0.8 % vaginal cream Place 1 applicator vaginally at bedtime. X 3 Days   No current facility-administered medications for this visit. (Other)      REVIEW OF  SYSTEMS:    ALLERGIES No Known Allergies  PAST MEDICAL HISTORY Past Medical History:  Diagnosis Date   Allergy    Hypertension    Migraine    Rosacea    History reviewed. No pertinent surgical history.  FAMILY HISTORY Family History  Problem Relation Age of Onset   Breast cancer Mother 80   Hypertension Father    Cancer Father        Colon Cancer   Diabetes Maternal Uncle    Heart disease Maternal Grandmother    Diabetes Maternal Grandfather     SOCIAL HISTORY Social History   Tobacco Use   Smoking status: Never   Smokeless tobacco: Never  Substance Use Topics   Alcohol use: No   Drug use: No         OPHTHALMIC EXAM:  Base Eye Exam     Visual Acuity (ETDRS)       Right Left   Dist cc 20/20 -1 20/30 +2    Correction: Glasses         Tonometry (Tonopen, 9:32 AM)       Right Left   Pressure 15 12         Pupils       Pupils Dark Light APD   Right  PERRL 5 4 None   Left PERRL 5 4 None         Visual Fields (Counting fingers)       Left Right    Full Full         Extraocular Movement       Right Left    Full Full         Neuro/Psych     Oriented x3: Yes   Mood/Affect: Normal         Dilation     Both eyes: 1.0% Mydriacyl, 2.5% Phenylephrine @ 9:32 AM           Slit Lamp and Fundus Exam     External Exam       Right Left   External Normal Normal         Slit Lamp Exam       Right Left   Lids/Lashes Normal Normal   Conjunctiva/Sclera White and quiet White and quiet   Cornea Clear Clear   Anterior Chamber Deep and quiet Deep and quiet   Iris Round and reactive Round and reactive   Lens 1+ Nuclear sclerosis 1+ Nuclear sclerosis   Anterior Vitreous Normal Normal         Fundus Exam       Right Left   Posterior Vitreous Posterior vitreous detachment Posterior vitreous detachment   Disc Normal Normal   C/D Ratio 0.3 0.3   Macula Hard drusen, no macular thickening Hard drusen, no macular  thickening, Epiretinal membrane minor, not clinically visible   Vessels Normal Normal   Periphery Normal Normal            IMAGING AND PROCEDURES  Imaging and Procedures for 08/11/21           ASSESSMENT/PLAN:  Left epiretinal membrane Minor OS, no interval change over the last2-1/2 years of follow-up with no impact on acuity  Nuclear sclerotic cataract of both eyes Followed by Dr. Antony Contras.  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  Thus optional vitamin therapy  Left retinoschisis Minor inner foveal change from the tractional forces of VMA, observe  Vitreomacular adhesion of left eye Minor OS observe  Posterior vitreous detachment of right eye OD physiologic     ICD-10-CM   1. Early stage nonexudative age-related macular degeneration of both eyes  H35.3131 OCT, Retina - OU - Both Eyes    2. Left epiretinal membrane  H35.372     3. Nuclear sclerotic cataract of both eyes  H25.13     4. Left retinoschisis  H33.102     5. Vitreomacular adhesion of left eye  H43.822     6. Posterior vitreous detachment of right eye  H43.811       1.  OS with no interval change and minor epiretinal membrane we will continue to observe  2.  Follow-up with Dr. Antony Contras as scheduled.  I agree that progressive NSC changes are present.  3.  Ophthalmic Meds Ordered this visit:  No orders of the defined types were placed in this encounter.      Return in about 1 year (around 08/11/2022) for DILATE OU, COLOR FP, OCT.  There are no Patient Instructions on file for this visit.   Explained the diagnoses, plan, and follow up with the patient and they expressed understanding.  Patient expressed  understanding of the importance of proper follow up care.   Alford Highland Lian Tanori M.D. Diseases  & Surgery of the Retina and Vitreous [facility] 08/11/21     Abbreviations: M myopia (nearsighted); A astigmatism; H hyperopia (farsighted); P presbyopia; Mrx spectacle prescription;  CTL contact lenses; OD right eye; OS left eye; OU both eyes  XT exotropia; ET esotropia; PEK punctate epithelial keratitis; PEE punctate epithelial erosions; DES dry eye syndrome; MGD meibomian gland dysfunction; ATs artificial tears; PFAT's preservative free artificial tears; NSC nuclear sclerotic cataract; PSC posterior subcapsular cataract; ERM epi-retinal membrane; PVD posterior vitreous detachment; RD retinal detachment; DM diabetes mellitus; DR diabetic retinopathy; NPDR non-proliferative diabetic retinopathy; PDR proliferative diabetic retinopathy; CSME clinically significant macular edema; DME diabetic macular edema; dbh dot blot hemorrhages; CWS cotton wool spot; POAG primary open angle glaucoma; C/D cup-to-disc ratio; HVF humphrey visual field; GVF goldmann visual field; OCT optical coherence tomography; IOP intraocular pressure; BRVO Branch retinal vein occlusion; CRVO central retinal vein occlusion; CRAO central retinal artery occlusion; BRAO branch retinal artery occlusion; RT retinal tear; SB scleral buckle; PPV pars plana vitrectomy; VH Vitreous hemorrhage; PRP panretinal laser photocoagulation; IVK intravitreal kenalog; VMT vitreomacular traction; MH Macular hole;  NVD neovascularization of the disc; NVE neovascularization elsewhere; AREDS age related eye disease study; ARMD age related macular degeneration; POAG primary open angle glaucoma; EBMD epithelial/anterior basement membrane dystrophy; ACIOL anterior chamber intraocular lens; IOL intraocular lens; PCIOL posterior chamber intraocular lens; Phaco/IOL phacoemulsification with intraocular lens placement; PRK photorefractive keratectomy; LASIK laser assisted in situ keratomileusis; HTN hypertension; DM diabetes mellitus; COPD chronic  obstructive pulmonary disease

## 2021-08-11 NOTE — Assessment & Plan Note (Signed)
Minor OS observe 

## 2021-08-11 NOTE — Assessment & Plan Note (Signed)
Very minor early stage macular degeneration with no accumulated debris that requires recommendation for vitamin therapy.  Thus optional vitamin therapy

## 2021-08-11 NOTE — Assessment & Plan Note (Signed)
Minor OS, no interval change over the last2-1/2 years of follow-up with no impact on acuity

## 2021-08-11 NOTE — Assessment & Plan Note (Signed)
Minor inner foveal change from the tractional forces of VMA, observe

## 2022-04-02 ENCOUNTER — Other Ambulatory Visit: Payer: Self-pay | Admitting: Family Medicine

## 2022-04-02 DIAGNOSIS — Z1231 Encounter for screening mammogram for malignant neoplasm of breast: Secondary | ICD-10-CM

## 2022-04-15 ENCOUNTER — Ambulatory Visit
Admission: RE | Admit: 2022-04-15 | Discharge: 2022-04-15 | Disposition: A | Discharge status: Home / Self Care | Payer: 59 | Source: Ambulatory Visit | Attending: Family Medicine | Admitting: Family Medicine

## 2022-04-15 DIAGNOSIS — Z1231 Encounter for screening mammogram for malignant neoplasm of breast: Secondary | ICD-10-CM

## 2022-08-11 ENCOUNTER — Ambulatory Visit (INDEPENDENT_AMBULATORY_CARE_PROVIDER_SITE_OTHER): Payer: 59 | Admitting: Ophthalmology

## 2022-08-11 ENCOUNTER — Encounter (INDEPENDENT_AMBULATORY_CARE_PROVIDER_SITE_OTHER): Payer: Self-pay | Admitting: Ophthalmology

## 2022-08-11 DIAGNOSIS — H43811 Vitreous degeneration, right eye: Secondary | ICD-10-CM | POA: Diagnosis not present

## 2022-08-11 DIAGNOSIS — H35372 Puckering of macula, left eye: Secondary | ICD-10-CM

## 2022-08-11 DIAGNOSIS — H2513 Age-related nuclear cataract, bilateral: Secondary | ICD-10-CM

## 2022-08-11 DIAGNOSIS — H353131 Nonexudative age-related macular degeneration, bilateral, early dry stage: Secondary | ICD-10-CM | POA: Diagnosis not present

## 2022-08-11 NOTE — Assessment & Plan Note (Signed)
Minor epiretinal membrane on foveal distorted no change year-over-year continue to observe no impact on acuity

## 2022-08-11 NOTE — Assessment & Plan Note (Signed)

## 2022-08-11 NOTE — Progress Notes (Signed)
08/11/2022     CHIEF COMPLAINT Patient presents for  Chief Complaint  Patient presents with   Macular Degeneration      HISTORY OF PRESENT ILLNESS: Valerie Glass is a 61 y.o. female who presents to the clinic today for:   HPI   Age related macular degeneration of both eyes, and VMT OS 1 year fu ou oct fp  Pt states her vision has been stable Pt denies any new floaters or FOL Last edited by Hurman Horn, MD on 08/11/2022  9:00 AM.      Referring physician: Jonathon Resides, MD [place],  [place] [address]  HISTORICAL INFORMATION:   Selected notes from the [facility]: No current outpatient medications on file. (Ophthalmic Drugs)   No current facility-administered medications for this visit. (Ophthalmic Drugs)   Current Outpatient Medications (Other)  Medication Sig   amLODipine-benazepril (LOTREL) 5-10 MG per capsule Take 1 capsule by mouth daily.   amoxicillin-clavulanate (AUGMENTIN) 875-125 MG tablet Take 1 tablet by mouth 2 (two) times daily. One po bid x 7 days   eletriptan (RELPAX) 40 MG tablet Take 1 tablet (40 mg total) by mouth as needed for migraine. One tablet by mouth at onset of headache. May repeat in 2 hours if headache persists or recurs.   FLUoxetine (PROZAC) 40 MG capsule Take 1 capsule (40 mg total) by mouth daily.   hydrochlorothiazide (HYDRODIURIL) 25 MG tablet Take 1 tablet (25 mg total) by mouth daily.   omega-3 acid ethyl esters (LOVAZA) 1 G capsule Take 2 capsules (2 g total) by mouth 2 (two) times daily.   terconazole (TERAZOL 3) 0.8 % vaginal cream Place 1 applicator vaginally at bedtime. X 3 Days   No current facility-administered medications for this visit. (Other)      REVIEW OF SYSTEMS: ROS   Negative for: Constitutional, Gastrointestinal, Neurological, Skin, Genitourinary, Musculoskeletal, HENT, Endocrine, Cardiovascular, Eyes, Respiratory, Psychiatric, Allergic/Imm,  Heme/Lymph Last edited by Morene Rankins, CMA on 08/11/2022  8:34 AM.       ALLERGIES No Known Allergies  PAST MEDICAL HISTORY Past Medical History:  Diagnosis Date   Allergy    Hypertension    Migraine    Rosacea    History reviewed. No pertinent surgical history.  FAMILY HISTORY Family History  Problem Relation Age of Onset   Breast cancer Mother 54   Hypertension Father    Cancer Father        Colon Cancer   Diabetes Maternal Uncle    Heart disease Maternal Grandmother    Diabetes Maternal Grandfather     SOCIAL HISTORY Social History   Tobacco Use   Smoking status: Never   Smokeless tobacco: Never  Substance Use Topics   Alcohol use: No   Drug use: No         OPHTHALMIC EXAM:  Base Eye Exam     Visual Acuity (ETDRS)       Right Left   Dist cc 20/20 20/25 -1    Correction: Glasses         Tonometry (Tonopen, 8:38 AM)       Right Left   Pressure 8 7         Pupils       Pupils   Right PERRL   Left PERRL         Visual Fields       Left Right  Full Full         Extraocular Movement       Right Left    Ortho Ortho    -- -- --  --  --  -- -- --   -- -- --  --  --  -- -- --           Neuro/Psych     Oriented x3: Yes   Mood/Affect: Normal         Dilation     Both eyes: 1.0% Mydriacyl, 2.5% Phenylephrine @ 8:35 AM           Slit Lamp and Fundus Exam     External Exam       Right Left   External Normal Normal         Slit Lamp Exam       Right Left   Lids/Lashes Normal Normal   Conjunctiva/Sclera White and quiet White and quiet   Cornea Clear Clear   Anterior Chamber Deep and quiet Deep and quiet   Iris Round and reactive Round and reactive   Lens 1+ Nuclear sclerosis 1+ Nuclear sclerosis   Anterior Vitreous Normal Normal         Fundus Exam       Right Left   Posterior Vitreous Posterior vitreous detachment Posterior vitreous detachment   Disc Normal Normal   C/D Ratio 0.3  0.3   Macula Hard drusen, no macular thickening Hard drusen, no macular thickening, Epiretinal membrane minor, not clinically visible   Vessels Normal Normal   Periphery Normal Normal            IMAGING AND PROCEDURES  Imaging and Procedures for 08/11/22  OCT, Retina - OU - Both Eyes       Right Eye Quality was good. Scan locations included subfoveal. Central Foveal Thickness: 284. Progression has been stable. Findings include normal observations, retinal drusen .   Left Eye Quality was good. Scan locations included subfoveal. Central Foveal Thickness: 315. Progression has been stable. Findings include abnormal foveal contour, retinal drusen , epiretinal membrane, vitreous traction, vitreomacular adhesion .   Notes OD with clean posterior vitreous detachment, normal foveal contour  OS vitreal macular adhesion and traction, none foveal distorting thus observe, with no high risk features evident.,  Minor epiretinal membrane, no topographic distortion will observe     Color Fundus Photography Optos - OU - Both Eyes       Right Eye Progression has no prior data. Disc findings include normal observations. Macula : drusen. Vessels : normal observations. Periphery : normal observations.   Left Eye Progression has no prior data. Disc findings include normal observations. Macula : drusen. Vessels : normal observations. Periphery : normal observations.   Notes No signs of wet AMD.             ASSESSMENT/PLAN:  Nuclear sclerotic cataract of both eyes Mild to moderate NSC changes.  Certainly not visually significant  Posterior vitreous detachment of right eye Physiologic Od  Left epiretinal membrane Minor epiretinal membrane on foveal distorted no change year-over-year continue to observe no impact on acuity  Early stage nonexudative age-related macular degeneration of both eyes The nature of age--related macular degeneration was discussed with the patient as well as  the distinction between dry and wet types. Checking an Amsler Grid daily with advice to return immediately should a distortion develop, was given to the patient. The patient 's smoking status now and in the past was determined and  advice based on the AREDS study was provided regarding the consumption of antioxidant supplements. AREDS 2 vitamin formulation was recommended. Consumption of dark leafy vegetables and fresh fruits of various colors was recommended. Treatment modalities for wet macular degeneration particularly the use of intravitreal injections of anti-blood vessel growth factors was discussed with the patient. Avastin, Lucentis, and Eylea are the available options. On occasion, therapy includes the use of photodynamic therapy and thermal laser. Stressed to the patient do not rub eyes.  Patient was advised to check Amsler Grid daily and return immediately if changes are noted. Instructions on using the grid were given to the patient. All patient questions were answered.      ICD-10-CM   1. Early stage nonexudative age-related macular degeneration of both eyes  H35.3131 OCT, Retina - OU - Both Eyes    Color Fundus Photography Optos - OU - Both Eyes    2. Nuclear sclerotic cataract of both eyes  H25.13     3. Posterior vitreous detachment of right eye  H43.811     4. Left epiretinal membrane  H35.372       1.  OU with early intermediate ARMD.  Patient is using optional AREDS 2  2.  Mild NSC changes OU.  Not visually significant  3.  Minor epiretinal membrane left eye no change over time will continue to observe  Ophthalmic Meds Ordered this visit:  No orders of the defined types were placed in this encounter.      Return in about 1 year (around 08/12/2023) for DILATE OU, COLOR FP, OCT.  There are no Patient Instructions on file for this visit.   Explained the diagnoses, plan, and follow up with the patient and they expressed understanding.  Patient expressed understanding of  the importance of proper follow up care.   Alford Highland Chalene Treu M.D. Diseases & Surgery of the Retina and Vitreous [facility] 08/11/22     Abbreviations: M myopia (nearsighted); A astigmatism; H hyperopia (farsighted); P presbyopia; Mrx spectacle prescription;  CTL contact lenses; OD right eye; OS left eye; OU both eyes  XT exotropia; ET esotropia; PEK punctate epithelial keratitis; PEE punctate epithelial erosions; DES dry eye syndrome; MGD meibomian gland dysfunction; ATs artificial tears; PFAT's preservative free artificial tears; NSC nuclear sclerotic cataract; PSC posterior subcapsular cataract; ERM epi-retinal membrane; PVD posterior vitreous detachment; RD retinal detachment; DM diabetes mellitus; DR diabetic retinopathy; NPDR non-proliferative diabetic retinopathy; PDR proliferative diabetic retinopathy; CSME clinically significant macular edema; DME diabetic macular edema; dbh dot blot hemorrhages; CWS cotton wool spot; POAG primary open angle glaucoma; C/D cup-to-disc ratio; HVF humphrey visual field; GVF goldmann visual field; OCT optical coherence tomography; IOP intraocular pressure; BRVO Branch retinal vein occlusion; CRVO central retinal vein occlusion; CRAO central retinal artery occlusion; BRAO branch retinal artery occlusion; RT retinal tear; SB scleral buckle; PPV pars plana vitrectomy; VH Vitreous hemorrhage; PRP panretinal laser photocoagulation; IVK intravitreal kenalog; VMT vitreomacular traction; MH Macular hole;  NVD neovascularization of the disc; NVE neovascularization elsewhere; AREDS age related eye disease study; ARMD age related macular degeneration; POAG primary open angle glaucoma; EBMD epithelial/anterior basement membrane dystrophy; ACIOL anterior chamber intraocular lens; IOL intraocular lens; PCIOL posterior chamber intraocular lens; Phaco/IOL phacoemulsification with intraocular lens placement; PRK photorefractive keratectomy; LASIK laser assisted in situ  keratomileusis; HTN hypertension; DM diabetes mellitus; COPD chronic obstructive pulmonary disease

## 2022-08-11 NOTE — Assessment & Plan Note (Addendum)
Physiologic Od

## 2022-08-11 NOTE — Assessment & Plan Note (Signed)
Mild to moderate NSC changes.  Certainly not visually significant

## 2023-06-18 ENCOUNTER — Other Ambulatory Visit: Payer: Self-pay | Admitting: Family Medicine

## 2023-06-18 ENCOUNTER — Ambulatory Visit
Admission: RE | Admit: 2023-06-18 | Discharge: 2023-06-18 | Disposition: A | Discharge status: Home / Self Care | Payer: 59 | Source: Ambulatory Visit | Attending: Family Medicine | Admitting: Family Medicine

## 2023-06-18 DIAGNOSIS — R0602 Shortness of breath: Secondary | ICD-10-CM

## 2023-08-16 ENCOUNTER — Encounter (INDEPENDENT_AMBULATORY_CARE_PROVIDER_SITE_OTHER): Payer: 59 | Admitting: Ophthalmology

## 2023-09-24 ENCOUNTER — Other Ambulatory Visit: Payer: Self-pay | Admitting: Family Medicine

## 2023-09-24 DIAGNOSIS — Z1231 Encounter for screening mammogram for malignant neoplasm of breast: Secondary | ICD-10-CM

## 2023-11-15 ENCOUNTER — Ambulatory Visit: Payer: 59

## 2023-12-03 ENCOUNTER — Ambulatory Visit
Admission: RE | Admit: 2023-12-03 | Discharge: 2023-12-03 | Disposition: A | Discharge status: Home / Self Care | Payer: 59 | Source: Ambulatory Visit | Attending: Family Medicine | Admitting: Family Medicine

## 2023-12-03 DIAGNOSIS — Z1231 Encounter for screening mammogram for malignant neoplasm of breast: Secondary | ICD-10-CM
